# Patient Record
Sex: Male | Born: 1965 | Race: White | Hispanic: No | Marital: Married | Smoking: Never smoker
Health system: Southern US, Community
[De-identification: ages and names within clinical notes are randomized; demographics above are authoritative.]

## PROBLEM LIST (undated history)

## (undated) DIAGNOSIS — B029 Zoster without complications: Secondary | ICD-10-CM

## (undated) DIAGNOSIS — R0789 Other chest pain: Secondary | ICD-10-CM

## (undated) DIAGNOSIS — G542 Cervical root disorders, not elsewhere classified: Secondary | ICD-10-CM

## (undated) HISTORY — PX: OTHER SURGICAL HISTORY: SHX169

## (undated) HISTORY — PX: EYE SURGERY: SHX253

## (undated) HISTORY — DX: Zoster without complications: B02.9

## (undated) HISTORY — PX: INGUINAL HERNIA REPAIR: SUR1180

## (undated) HISTORY — PX: TONSILLECTOMY: SHX5217

## (undated) HISTORY — DX: Cervical root disorders, not elsewhere classified: G54.2

---

## 2000-08-24 ENCOUNTER — Other Ambulatory Visit: Admission: RE | Admit: 2000-08-24 | Discharge: 2000-08-24 | Payer: Self-pay | Admitting: Urology

## 2006-03-13 ENCOUNTER — Encounter: Admission: RE | Admit: 2006-03-13 | Discharge: 2006-03-13 | Payer: Self-pay | Admitting: Neurological Surgery

## 2012-06-29 ENCOUNTER — Other Ambulatory Visit: Payer: Self-pay | Admitting: *Deleted

## 2012-06-29 DIAGNOSIS — R079 Chest pain, unspecified: Secondary | ICD-10-CM

## 2012-07-05 ENCOUNTER — Ambulatory Visit (INDEPENDENT_AMBULATORY_CARE_PROVIDER_SITE_OTHER): Payer: No Typology Code available for payment source | Admitting: Cardiovascular Disease

## 2012-07-05 DIAGNOSIS — R079 Chest pain, unspecified: Secondary | ICD-10-CM

## 2012-07-05 NOTE — Progress Notes (Signed)
Exercise Treadmill Test  Pre-Exercise Testing Evaluation Rhythm: normal sinus  Rate: 77   PR:  .15 QRS:  .10  QT:  .39 QTc: .44            Test  Exercise Tolerance Test Ordering MD: Charlton Haws, MD  Interpreting MD: Charlton Haws, MD  Unique Test No: 1  Treadmill:  1  Indication for ETT: chest pain - rule out ischemia  Contraindication to ETT: No   Stress Modality: exercise - treadmill  Cardiac Imaging Performed: non   Protocol: standard Bruce - maximal  Max BP:  180/90  Max MPHR (bpm):  173 85% MPR (bpm):  147  MPHR obtained (bpm):  180 % MPHR obtained:  >100  Reached 85% MPHR (min:sec):    Total Exercise Time (min-sec):  10"00  Workload in METS:  >10 Borg Scale: 17  Reason ETT Terminated:  Positive response    ST Segment Analysis At Rest: normal ST segments - no evidence of significant ST depression With Exercise: significant ischemic ST depression  Other Information Arrhythmia:  No Angina during ETT:  absent (0) Quality of ETT:  diagnostic  ETT Interpretation:  abnormal - evidence of ST depression consistent with ischemia  Comments: Patient exercise well with first 4 stages advanced at 2 minute intervals.  Started to get ST depression at HR of 120 At peak exercise Had 2mm of ST segment depression in inferior lateral leads  Recommendations: 46 yo with positive family history of CAD.  One admitted episode of chest tightness with exercise about 2 weeks ago  Needs anatomic study Since overall clinical risk still low would favor cardiac CT over invasive heart catheterization.  Patient given bystolic to take if aproved.   Exertional dyspnea with exercise and no chest pain today

## 2012-07-06 ENCOUNTER — Other Ambulatory Visit: Payer: Self-pay | Admitting: *Deleted

## 2012-07-06 DIAGNOSIS — R079 Chest pain, unspecified: Secondary | ICD-10-CM

## 2012-07-06 DIAGNOSIS — R9439 Abnormal result of other cardiovascular function study: Secondary | ICD-10-CM

## 2012-07-07 ENCOUNTER — Ambulatory Visit (HOSPITAL_COMMUNITY)
Admission: RE | Admit: 2012-07-07 | Discharge: 2012-07-07 | Disposition: A | Discharge status: Home / Self Care | Payer: No Typology Code available for payment source | Source: Ambulatory Visit | Attending: Cardiovascular Disease | Admitting: Cardiovascular Disease

## 2012-07-07 DIAGNOSIS — R079 Chest pain, unspecified: Secondary | ICD-10-CM

## 2012-07-07 DIAGNOSIS — R9439 Abnormal result of other cardiovascular function study: Secondary | ICD-10-CM | POA: Insufficient documentation

## 2012-07-07 MED ORDER — IOHEXOL 350 MG/ML SOLN
100.0000 mL | Freq: Once | INTRAVENOUS | Status: AC | PRN
  Administered 2012-07-07: 100 mL via INTRAVENOUS

## 2012-07-07 MED ORDER — METOPROLOL TARTRATE 1 MG/ML IV SOLN
2.5000 mg | Freq: Once | INTRAVENOUS | Status: AC
  Administered 2012-07-07 (×2): 2.5 mg via INTRAVENOUS
  Filled 2012-07-07: qty 5

## 2012-07-07 MED ORDER — NITROGLYCERIN 0.4 MG SL SUBL
SUBLINGUAL_TABLET | SUBLINGUAL | Status: AC
  Administered 2012-07-07: 0.4 mg
  Filled 2012-07-07: qty 25

## 2012-07-07 MED ORDER — METOPROLOL TARTRATE 1 MG/ML IV SOLN
INTRAVENOUS | Status: AC
  Administered 2012-07-07: 2.5 mg via INTRAVENOUS
  Filled 2012-07-07: qty 10

## 2012-08-21 ENCOUNTER — Encounter (HOSPITAL_COMMUNITY): Payer: Self-pay | Admitting: *Deleted

## 2012-08-21 ENCOUNTER — Emergency Department (HOSPITAL_COMMUNITY)
Admission: EM | Admit: 2012-08-21 | Discharge: 2012-08-22 | Disposition: A | Discharge status: Home / Self Care | Payer: No Typology Code available for payment source | Attending: Emergency Medicine | Admitting: Emergency Medicine

## 2012-08-21 DIAGNOSIS — Z23 Encounter for immunization: Secondary | ICD-10-CM | POA: Insufficient documentation

## 2012-08-21 DIAGNOSIS — S0100XA Unspecified open wound of scalp, initial encounter: Secondary | ICD-10-CM | POA: Insufficient documentation

## 2012-08-21 DIAGNOSIS — W19XXXA Unspecified fall, initial encounter: Secondary | ICD-10-CM

## 2012-08-21 DIAGNOSIS — Z8679 Personal history of other diseases of the circulatory system: Secondary | ICD-10-CM | POA: Insufficient documentation

## 2012-08-21 DIAGNOSIS — Y9389 Activity, other specified: Secondary | ICD-10-CM | POA: Insufficient documentation

## 2012-08-21 DIAGNOSIS — W1809XA Striking against other object with subsequent fall, initial encounter: Secondary | ICD-10-CM | POA: Insufficient documentation

## 2012-08-21 DIAGNOSIS — Y92009 Unspecified place in unspecified non-institutional (private) residence as the place of occurrence of the external cause: Secondary | ICD-10-CM | POA: Insufficient documentation

## 2012-08-21 DIAGNOSIS — S0101XA Laceration without foreign body of scalp, initial encounter: Secondary | ICD-10-CM

## 2012-08-21 DIAGNOSIS — S060X9A Concussion with loss of consciousness of unspecified duration, initial encounter: Secondary | ICD-10-CM | POA: Insufficient documentation

## 2012-08-21 HISTORY — DX: Other chest pain: R07.89

## 2012-08-21 NOTE — ED Notes (Addendum)
Pt. Exp. Near syncope, without loc, in the bathroom and kitchen. Pt. Had wine last night. Denies neck and back pain. Hematoma on back of head, laceration, rt. Side. Bleeding controlled.

## 2012-08-22 ENCOUNTER — Emergency Department (HOSPITAL_COMMUNITY): Payer: No Typology Code available for payment source

## 2012-08-22 MED ORDER — ACETAMINOPHEN 325 MG PO TABS
650.0000 mg | ORAL_TABLET | Freq: Once | ORAL | Status: AC
  Administered 2012-08-22: 650 mg via ORAL
  Filled 2012-08-22: qty 2

## 2012-08-22 MED ORDER — TETANUS-DIPHTH-ACELL PERTUSSIS 5-2.5-18.5 LF-MCG/0.5 IM SUSP
0.5000 mL | Freq: Once | INTRAMUSCULAR | Status: AC
  Administered 2012-08-22: 0.5 mL via INTRAMUSCULAR
  Filled 2012-08-22: qty 0.5

## 2012-08-22 NOTE — ED Provider Notes (Signed)
History     CSN: [id number]  Arrival date & time 08/21/12  2342   First MD Initiated Contact with Patient 08/21/12 2342      Chief Complaint  Patient presents with  . Near Syncope  . Fall    (Consider location/radiation/quality/duration/timing/severity/associated sxs/prior treatment) The history is provided by the patient and the spouse.   47 year old male had a syncopal episode at home. He first had an episode where he was near syncopal and slumped to the floor. This occurred during urination. He ended his urinary stream. He denies injury at that time and his wife stated that he just slumped down and did not hit his head or hit anything with any force. He then went into his kitchen where he did fall out completely and hit the floor hard and apparently suffered a scalp laceration. He had amnesia around the events of the second fall and may have had fairly brief loss of consciousness. He currently has a headache which he rates it 5/10. He is unsure when his last tetanus immunization was. He denies other injury. He specifically denies neck or back pain. Of note, he recently had a cardiac workup which included a normal coronary CT angiogram. He also states that he had 2 glasses of wine earlier in the evening.  Past Medical History  Diagnosis Date  . Chest tightness     Past Surgical History  Procedure Date  . Stress test     History reviewed. No pertinent family history.  History  Substance Use Topics  . Smoking status: Never Smoker   . Smokeless tobacco: Not on file  . Alcohol Use: Yes      Review of Systems  All other systems reviewed and are negative.    Allergies  Tetracyclines & related  Home Medications  No current outpatient prescriptions on file.  There were no vitals taken for this visit.  Physical Exam  Nursing note and vitals reviewed.  47 year old male, with stiff cervical collar in place, resting comfortably and in no acute distress. Vital signs are  normal. Oxygen saturation is 100%, which is normal. Head is normocephalic there is a 1 cm scalp laceration on the occiput. PERRLA, EOMI. Oropharynx is clear. Neck is nontender. Back is nontender and there is no CVA tenderness. Lungs are clear without rales, wheezes, or rhonchi. Chest is nontender. Heart has regular rate and rhythm without murmur. Abdomen is soft, flat, nontender without masses or hepatosplenomegaly and peristalsis is normoactive. Extremities have no cyanosis or edema, full range of motion is present. Skin is warm and dry without rash. Neurologic: Mental status is normal, cranial nerves are intact, there are no motor or sensory deficits.  ED Course  Procedures (including critical care time)  LACERATION REPAIR Performed by: NWGNF,AOZHY Authorized by: QMVHQ,IONGE Consent: Verbal consent obtained. Risks and benefits: risks, benefits and alternatives were discussed Consent given by: patient Patient identity confirmed: provided demographic data Prepped and Draped in normal sterile fashion Wound explored  Laceration Location: scalp  Laceration Length: 1cm  No Foreign Bodies seen or palpated  Anesthesia: none  Amount of cleaning: standard  Skin closure: close   Number of staples: 2  Technique: stapling  Patient tolerance: Patient tolerated the procedure well with no immediate complications.   Ct Head Wo Contrast  08/22/2012  *RADIOLOGY REPORT*  Clinical Data:  Near syncope and fall.  Hematoma on the back of head with right-sided laceration.  CT HEAD WITHOUT CONTRAST CT CERVICAL SPINE WITHOUT CONTRAST  Technique:  Multidetector CT imaging of the head and cervical spine was performed following the standard protocol without intravenous contrast.  Multiplanar CT image reconstructions of the cervical spine were also generated.  Comparison:   None  CT HEAD  Findings: The ventricles and sulci are symmetrical without significant effacement, displacement, or dilatation. No  mass effect or midline shift. No abnormal extra-axial fluid collections. The grey-white matter junction is distinct. Basal cisterns are not effaced. No acute intracranial hemorrhage. No depressed skull fractures.  There is an air-fluid level in the left maxillary antrum which could be due to inflammatory or post-traumatic etiology.  Mastoid air cells are not opacified.  Small subcutaneous soft tissue hematoma over the right posterior vertex.  IMPRESSION: No acute intracranial abnormalities. Nonspecific air fluid level in the left maxillary antrum.  CT CERVICAL SPINE  Findings: Normal alignment of the cervical vertebrae and facet joints.  Mild degenerative changes with intervertebral disc space narrowing and hypertrophic changes at the endplates.  No vertebral compression deformities.  No prevertebral soft tissue swelling. Lateral masses of C1 appear symmetrical and the odontoid process appears intact.  No focal bone lesion or bone destruction.  The bone cortex and trabecular architecture appear intact.  No paraspinal soft tissue swelling.  IMPRESSION: No displaced fractures identified.   Original Report Authenticated By: Burman Nieves, M.D.    Ct Cervical Spine Wo Contrast  08/22/2012  *RADIOLOGY REPORT*  Clinical Data:  Near syncope and fall.  Hematoma on the back of head with right-sided laceration.  CT HEAD WITHOUT CONTRAST CT CERVICAL SPINE WITHOUT CONTRAST  Technique:  Multidetector CT imaging of the head and cervical spine was performed following the standard protocol without intravenous contrast.  Multiplanar CT image reconstructions of the cervical spine were also generated.  Comparison:   None  CT HEAD  Findings: The ventricles and sulci are symmetrical without significant effacement, displacement, or dilatation. No mass effect or midline shift. No abnormal extra-axial fluid collections. The grey-white matter junction is distinct. Basal cisterns are not effaced. No acute intracranial hemorrhage. No  depressed skull fractures.  There is an air-fluid level in the left maxillary antrum which could be due to inflammatory or post-traumatic etiology.  Mastoid air cells are not opacified.  Small subcutaneous soft tissue hematoma over the right posterior vertex.  IMPRESSION: No acute intracranial abnormalities. Nonspecific air fluid level in the left maxillary antrum.  CT CERVICAL SPINE  Findings: Normal alignment of the cervical vertebrae and facet joints.  Mild degenerative changes with intervertebral disc space narrowing and hypertrophic changes at the endplates.  No vertebral compression deformities.  No prevertebral soft tissue swelling. Lateral masses of C1 appear symmetrical and the odontoid process appears intact.  No focal bone lesion or bone destruction.  The bone cortex and trabecular architecture appear intact.  No paraspinal soft tissue swelling.  IMPRESSION: No displaced fractures identified.   Original Report Authenticated By: Burman Nieves, M.D.      Date: 08/22/2012  Rate: 85  Rhythm: normal sinus rhythm  QRS Axis: normal  Intervals: normal  ST/T Wave abnormalities: normal  Conduction Disutrbances:Incomplete right bundle-branch block  Narrative Interpretation: Left atrial hypertrophy, incomplete right palmar branch block. No prior ECG available for comparison.  Old EKG Reviewed: none available    1. Fall   2. Occipital scalp laceration   3. Concussion       MDM  Initial near syncopal episode sounds like micturition syncope. Etiology of second episode is unclear. With that amnesia around the events, it does appear  that he has at least a mild concussion. TdAP Booster will be given, and he will be sent for CT of head and cervical spine. Scalp laceration will need stapling.        Dione Booze, MD 08/22/12 [id number]

## 2012-10-11 ENCOUNTER — Telehealth: Payer: Self-pay | Admitting: Neurology

## 2012-10-11 ENCOUNTER — Encounter: Payer: Self-pay | Admitting: Neurology

## 2012-10-11 ENCOUNTER — Ambulatory Visit (INDEPENDENT_AMBULATORY_CARE_PROVIDER_SITE_OTHER): Payer: 59 | Admitting: Neurology

## 2012-10-11 VITALS — BP 145/94 | HR 80 | Ht 73.75 in | Wt 180.0 lb

## 2012-10-11 DIAGNOSIS — B029 Zoster without complications: Secondary | ICD-10-CM

## 2012-10-11 DIAGNOSIS — G542 Cervical root disorders, not elsewhere classified: Secondary | ICD-10-CM

## 2012-10-11 HISTORY — DX: Cervical root disorders, not elsewhere classified: G54.2

## 2012-10-11 HISTORY — DX: Zoster without complications: B02.9

## 2012-10-11 NOTE — Progress Notes (Signed)
Reason for visit: Shingles  Bobby Parker is an 47 y.o. 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 The patient began having severe pain in the left neck and shoulder that he interpreted as being muscle spasm. A CT scan of the cervical spine previously that showed a bone spur off to the left at the C4-5 level. He went on prednisone therapy, and Flexeril. Within 4 or 5 days after onset of pain, a rash was noted around the left elbow going down into the forearm and thumb. The rash extended upwards into the deltoid area. The rash was vesicular, and followed the C6 nerve root distribution. Around that time, he noted onset of weakness involving the left hand and arm. The pain has improved with treatment on prednisone and acyclovir. He will complete a seven-day course within the next 24 hours. The prednisone therapy is ongoing, and he is currently taking 20 mg daily. The patient indicates that there is some itching and numbness in the distribution of the rash. There have been no problems with the right arm, or with the legs. No problems with balance or problems controlling the bowels or the bladder had been noted. There is no pain down the arm with turning the head side to side or looking up or looking down. Approximately one month ago, he had a syncopal event, and struck his head. CT evaluation of the brain and cervical spine was done through the emergency room, and there was a mild postconcussive syndrome. The syncope has not recurred. This was associated with urinary incontinence.   ROS:  Out of a complete 14 system review of symptoms, the patient complains only of the following symptoms, and all other reviewed systems are negative.  Fatigue Rash Joint pain Muscle cramp Achy muscles Numbness Weakness Insomnia Decreased energy   Blood pressure 145/94, pulse 80, height 6' 1.75"  (1.873 m), weight 180 lb (81.647 kg).  Physical Exam  General: The patient is alert and cooperative at the time of the examination.  Head: Pupils are equal, round, and reactive to light. Discs are flat bilaterally.  Neck: The neck is supple, no carotid bruits are noted. The patient has excellent range of motion of the cervical spine.  Respiratory: The respiratory examination is clear.  Cardiovascular: The cardiovascular examination reveals a regular rate and rhythm, no obvious murmurs or rubs are noted.  Skin: Extremities are without significant edema. A C6 distribution healing shingles rash is noted on the left.  Neurologic Exam  Mental status: Alert and cooperative.  Cranial nerves: Facial symmetry is present. There is good sensation of the face to pinprick and soft touch bilaterally. The strength of the facial muscles and the muscles to head turning and shoulder shrug are normal bilaterally. Speech is well enunciated, no aphasia or dysarthria is noted. Extraocular movements are full. Visual fields are full.  Motor: The motor testing reveals 5 over 5 strength of the lower and right upper extremities. With the left upper extremity, there is 4/5 strength involving the external rotators, trace weakness of the left deltoid, and pronator teres muscle. There is 4 minus over 5 strength with the wrist extensors. No definite weakness of the intrinsic muscles of the left hand was noted. There may be slight weakness however, involving muscles that oppose the thumb. The left biceps muscle is trace weak. Good symmetric motor tone is noted throughout.  Sensory: Sensory testing is intact to pinprick, soft touch, vibration sensation, and position sense on all 4 extremities, with the exception that there is some decrease in pinprick sensation along the area of the C6 distribution rash on the left arm. No evidence of extinction is noted.  Coordination: Cerebellar testing reveals good finger-nose-finger and  heel-to-shin bilaterally.  Gait and station: Gait is normal. Tandem gait is normal. Romberg is negative. No drift is seen  Reflexes: Deep tendon reflexes are symmetric and normal bilaterally, with the exception that there is absence of the left ankle jerk reflex and left biceps reflex. The brachioradialis reflexes are depressed bilaterally. Toes are downgoing bilaterally.   Assessment/Plan:  One. Shingles outbreak, C6 level  2. Left upper extremity weakness secondary to #1.  The patient appears to have weakness mainly in the distribution of the C6 nerve root, but there may be some crossover into the C7 nerve root. The patient could have some involvement of the brachial plexus or multiple nerve roots. The rash is slowly within the C6 nerve root distribution. At this point, the patient will taper off of prednisone, and complete the acyclovir dosing. We will follow conservatively, but if return of motor function does not occur within the next 6-8 weeks, an EMG evaluation may be helpful for prognostication. 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 I will refer him to an occupational therapist for evaluation of a brace for the wrist drop that is present on the left arm. He will followup in about 2 months.   [name] MD 10/11/2012 9:46 AM

## 2012-10-11 NOTE — Patient Instructions (Signed)

## 2012-10-11 NOTE — Progress Notes (Deleted)
Reason for visit: Shingles  Bobby Parker is an 47 y.o. 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 The patient began having severe pain in the left neck and shoulder that he interpreted as being muscle spasm. A CT scan of the cervical spine previously that showed a bone spur off to the left at the C4-5 level. He went on prednisone therapy, and Flexeril. Within 4 or 5 days after onset of pain, a rash was noted around the left elbow going down into the forearm and thumb. The rash extended upwards into the deltoid area. The rash was vesicular, and follow the C6 nerve root distribution. Around that time, he noted onset of weakness involving the hand and arm. The pain has improved with treatment on prednisone and acyclovir. He will complete a seven-day course within the next 24 hours. The prednisone therapy is ongoing, and he is currently taking 20 mg daily. The patient indicates that there is some seen and numbness in the distribution of the rash. There have been no problems with the right arm, with the legs. No problems with balance or problems controlling the bowels or the bladder had been noted. There is no pain down the arm with turning the head side to side or looking up or looking down. Approximately one month ago, he had a syncopal event, and struck his head. CT evaluation of the brain and cervical spine was done through the emergency room, and there was a mild postconcussive syndrome. The syncope has not recurred. This was associated with urinary incontinence   ROS:  Out of a complete 14 system review of symptoms, the patient complains only of the following symptoms, and all other reviewed systems are negative.  Fatigue Rash Joint pain Muscle cramp Achy muscles Numbness Weakness Insomnia Decreased energy   Blood pressure 145/94, pulse 80, height 6' 1.75" (1.873 m),  weight 180 lb (81.647 kg).  Physical Exam  General: The patient is alert and cooperative at the time of the examination.  Skin: No significant peripheral edema is noted.   Neurologic Exam  Cranial nerves: Facial symmetry is present. Speech is normal, no aphasia or dysarthria is noted. Extraocular movements are full. Visual fields are full.  Motor: The patient has good strength in all 4 extremities.  Coordination: The patient has good finger-nose-finger and heel-to-shin bilaterally.  Gait and station: The patient has a normal gait. Tandem gait is normal. Romberg is negative. No drift is seen.  Reflexes: Deep tendon reflexes are symmetric.   Assessment/Plan:  One. Shingles outbreak, C6 level  2. Left upper extremity weakness secondary to #1.  The patient appears to have weakness mainly in the distribution of the C6 nerve root, but there may be some crossover into the C7 nerve root. The patient could have some involvement of the brachial plexus or multiple nerve roots. The rash is slowly within the C6 nerve root distribution. At this point, the patient will taper off of prednisone, and completely acyclovir dosing. We will follow conservatively, but if return of motor function is not occur within the next 6-8 weeks, and EMG evaluation may be helpful for prognostication. 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 I will refer him to an occupational therapist for evaluation of a brace for the wrist drop that is present on the left arm.  He will followup in about 2 months.   [name] MD 10/11/2012 9:21 AM

## 2014-04-04 IMAGING — CT CT HEART MORP W/ CTA COR W/ SCORE W/ CA W/CM &/OR W/O CM
2 of 7 series · 10 of 20 positions shown, 12 images · non-contrast
Comparison: Plain film 03/13/2006.

OVER-READ INTERPRETATION - CT CHEST

The following report is an over-read performed by radiologist Dr.
[DATE].  This over-read does not include interpretation of
cardiac or coronary anatomy or pathology.  The CTA interpretation
by the cardiologist is attached.
INDICATION: Chest Pain Positive Exercise Treadmill
PROTOCOL: The patient was scanned on a [employer] 256 scanner. Gantry
rotation speed was 270 msec. Collimation was .9mm.  The patient
received bystolic orally and 5 mg of iv lopresser during the scan.
Average HR during scan was 62 bpm.  SL nitro also given.  A 100 kV
prospective scan was triggered in the descending aorta at 111 HU's.
5% padding around 78% of the R-R interval was used to give 3
diagnostic phases.  iDose was [DATE] to minimize dose.  After an
initial AP and lateral topogram, non-contrast 3mm axial slices were
done for calcium scoring.  80 cc of contrast was then given for
CTA.  The 3D data set was viewed on a dedicated [employer] and
[employer] work station using MIP, VRT and MPR modes.

[Series 8: w/ edge cor., 78.0% · axial · 0.49mm/px · z∈[-241,-148]mm · 5 of 311 slices shown, 7 images]
[im 52/311  vessel]
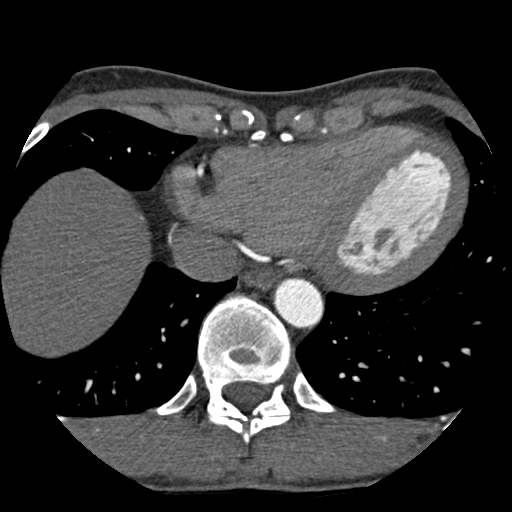
[im 52/311  lung]
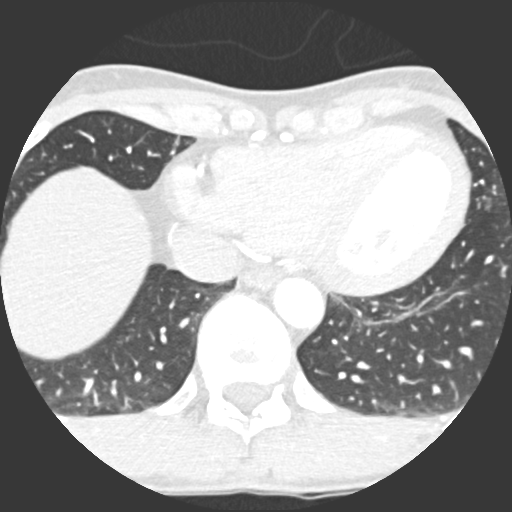
[im 104/311  vessel]
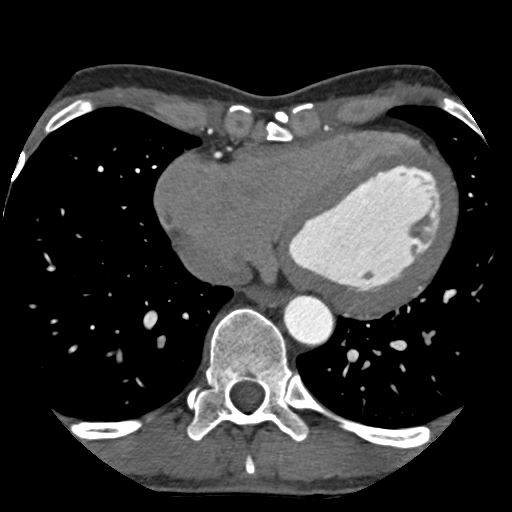
[im 156/311  vessel]
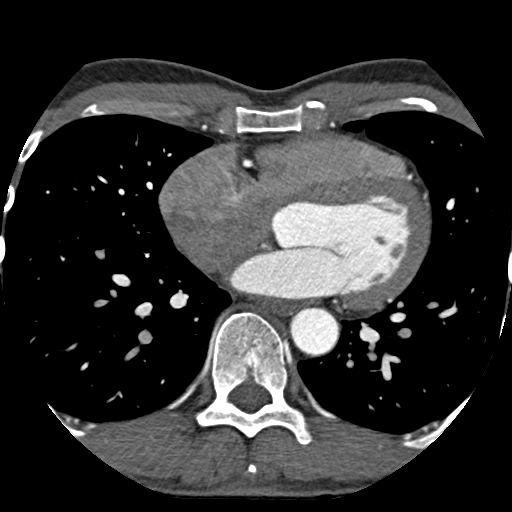
[im 207/311  vessel]
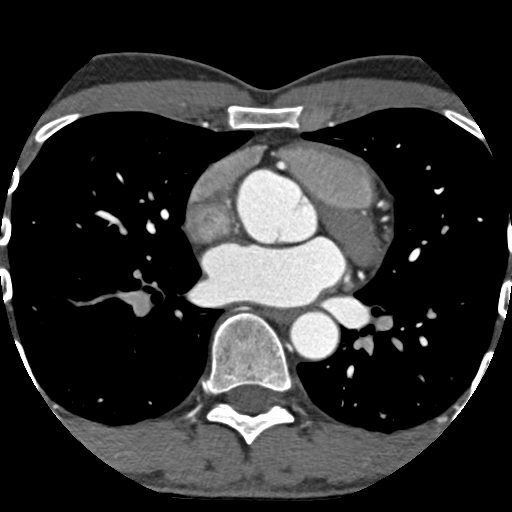
[im 259/311  vessel]
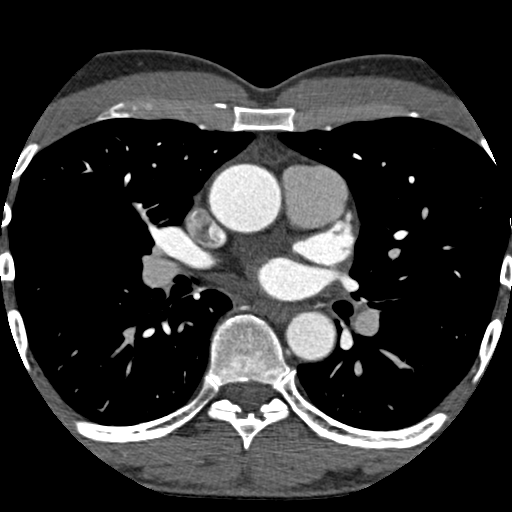
[im 259/311  lung]
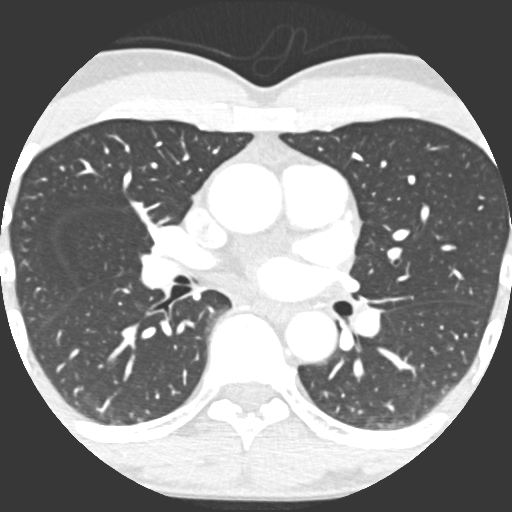

[Series 9: w/o edge corr., 78.0% · axial · non-contrast · 0.49mm/px · z∈[-241,-148]mm · 5 of 311 slices shown]
[im 52/311  lung]
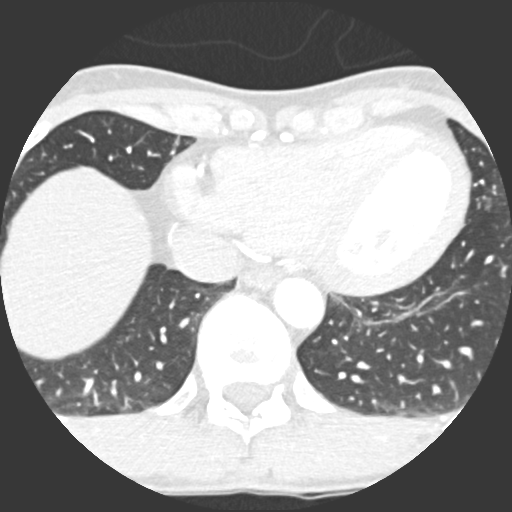
[im 104/311  lung]
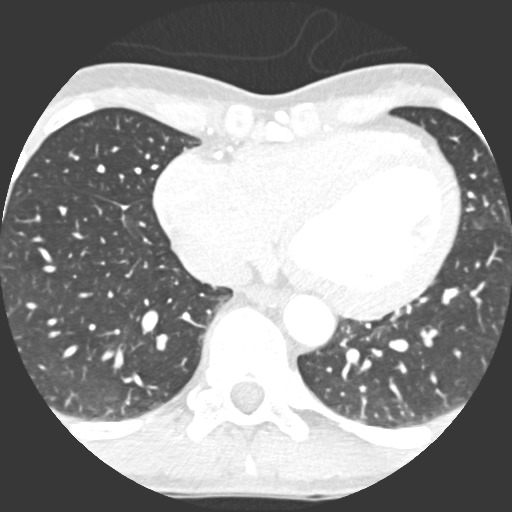
[im 156/311  lung]
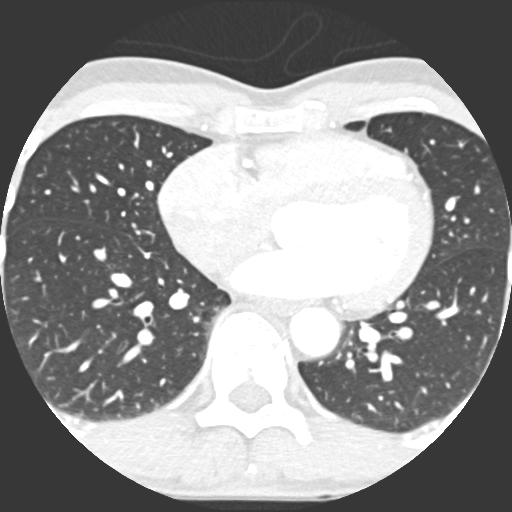
[im 207/311  lung]
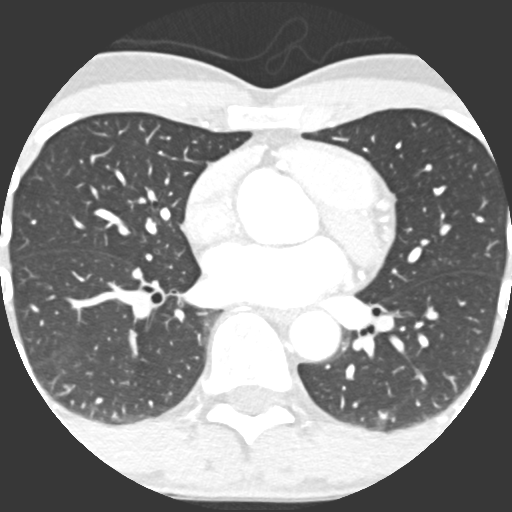
[im 259/311  lung]
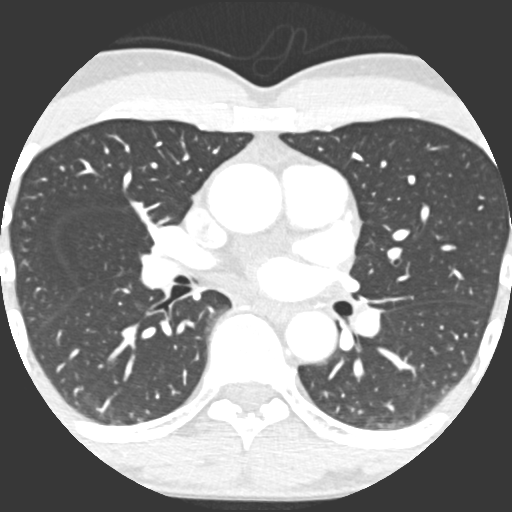

[10 of 20 positions shown; findings below may reference images not displayed]

FINDINGS: Lung windows demonstrate no nodules or airspace
opacities.

Soft tissue windows demonstrate a pectus excavatum deformity, which
accentuates the heart size.  No pericardial or pleural effusion.
Normal aortic caliber, without evidence of dissection.  No imaged
thoracic adenopathy.  No central pulmonary embolism, on this non-
dedicated study.

Limited abdominal imaging demonstrates no significant findings.
Mild spondylosis involving the mid to lower thoracic spine.
IMPRESSION: No significant extracardiac findings within the imaged chest.

Cardiac CT:
FINDINGS: Calcium Score: 0

Coronary Arteries: Right dominant with no anomalies

LM- normal

LAD- normal

      D1- large branch and normal

Circumflex- normal. The AV groove branch was small after the take
off of OM1

      OM1- large and normal

      OM2- small and normal

RCA- dominant and normal

      PDA- normal

      PLA- normal

Ascending aorta was normal with axial measurement at RPA of 3.4 cm.
No aneurysm or dissection

Non-cardiac Finings:  See separate report from Dr Chanra  Thoracic
osteophyte and mild pectus deformity only
IMPRESSION: 1)    Normal right dominant coronary arteries

2)    Calcium Score 0

## 2019-12-21 DIAGNOSIS — B5801 Toxoplasma chorioretinitis: Secondary | ICD-10-CM | POA: Insufficient documentation

## 2020-01-12 ENCOUNTER — Other Ambulatory Visit: Payer: Self-pay

## 2020-01-12 ENCOUNTER — Ambulatory Visit (INDEPENDENT_AMBULATORY_CARE_PROVIDER_SITE_OTHER): Payer: PRIVATE HEALTH INSURANCE | Admitting: Ophthalmology

## 2020-01-12 ENCOUNTER — Encounter (INDEPENDENT_AMBULATORY_CARE_PROVIDER_SITE_OTHER): Payer: No Typology Code available for payment source | Admitting: Ophthalmology

## 2020-01-12 ENCOUNTER — Encounter (INDEPENDENT_AMBULATORY_CARE_PROVIDER_SITE_OTHER): Payer: Self-pay | Admitting: Ophthalmology

## 2020-01-12 ENCOUNTER — Telehealth: Payer: Self-pay

## 2020-01-12 DIAGNOSIS — H3091 Unspecified chorioretinal inflammation, right eye: Secondary | ICD-10-CM | POA: Diagnosis not present

## 2020-01-12 DIAGNOSIS — B5801 Toxoplasma chorioretinitis: Secondary | ICD-10-CM | POA: Diagnosis not present

## 2020-01-12 MED ORDER — CLEOCIN 150 MG PO CAPS: ORAL_CAPSULE | ORAL | 3 refills | Status: AC

## 2020-01-12 NOTE — Progress Notes (Signed)
01/12/2020     CHIEF COMPLAINT Patient presents for Retina Evaluation   HISTORY OF PRESENT ILLNESS: Bobby Parker is a 54 y.o. male who presents to the clinic today for:   HPI    Retina Evaluation    In right eye.  This started 1 month ago.  Associated Symptoms Floaters.  Negative for Flashes.  Context:  distance vision.  Response to treatment was no improvement.          Comments    Patient states a month ago he noticed small floaters OD. Patient states that he was referred to a retinal specialist that states it was a PVD. Patient states it began to get worse (more floaters, strings)- leading to the retina pulling away. 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       Last edited by Gerda Diss on 01/12/2020  1:23 PM. (History)      Referring physician: Gaynelle Arabian, MD [address],  [place] [address]  HISTORICAL INFORMATION:   Selected notes from the [facility]: No current outpatient medications on file. (Ophthalmic Drugs)   No current facility-administered medications for this visit. (Ophthalmic Drugs)   Current Outpatient Medications (Other)  Medication Sig  . acetaminophen (TYLENOL) 500 MG tablet Take 500 mg by mouth every 6 (six) hours as needed for pain. (Patient not taking: Reported on 01/12/2020)  . acyclovir (ZOVIRAX) 800 MG tablet Take 800 mg by mouth 2 (two) times daily. One tablet every four hrs. (Patient not taking: Reported on 01/12/2020)  . predniSONE (DELTASONE) 20 MG tablet Take 20 mg by mouth 2 (two) times daily. (Patient not taking: Reported on 01/12/2020)  . tadalafil (CIALIS) 20 MG tablet Take 10 mg by mouth daily as needed.   No current facility-administered medications for this visit. (Other)      REVIEW OF SYSTEMS:    ALLERGIES Allergies  Allergen Reactions  . Tetracyclines & Related Hives    urticaria    PAST  MEDICAL HISTORY Past Medical History:  Diagnosis Date  . Cervical root lesions, not elsewhere classified 10/11/2012   C6 Shingles, left, brachial plexus, C7  . Chest tightness   . Shingles outbreak 10/11/2012   Past Surgical History:  Procedure Laterality Date  . EYE SURGERY Bilateral    Lasix in 2007  . INGUINAL HERNIA REPAIR Right   . stress test    . TONSILLECTOMY      FAMILY HISTORY Family History  Problem Relation Age of Onset  . Hypertension Mother   . Hypertension Father   . Diabetes Father     SOCIAL HISTORY Social History   Tobacco Use  . Smoking status: Never Smoker  . Smokeless tobacco: Never Used  Substance Use Topics  . Alcohol use: Yes    Comment: 2-3 glasses weekly  . Drug use: No         OPHTHALMIC EXAM: Base Eye Exam    Visual Acuity (Snellen - Linear)      Right Left   Dist cc 20/40-1 20/20   Dist ph cc NI    Correction: Glasses       Tonometry (Tonopen, 1:26 PM)      Right Left   Pressure 10 10       Pupils      Pupils Dark Light Shape React APD   Right PERRL 4 3 Round Brisk  None   Left PERRL 4 3 Round Brisk None       Visual Fields (Counting fingers)      Left Right    Full Full       Extraocular Movement      Right Left    Full Full       Neuro/Psych    Oriented x3: Yes   Mood/Affect: Normal       Dilation    Both eyes: 1.0% Mydriacyl, 2.5% Phenylephrine @ 1:26 PM        Slit Lamp and Fundus Exam    External Exam      Right Left   External Normal Normal       Slit Lamp Exam      Right Left   Lids/Lashes Normal Normal   Conjunctiva/Sclera White and quiet White and quiet   Cornea Clear Clear   Anterior Chamber Deep and quiet Deep and quiet   Iris Round and reactive Round and reactive   Vitreous Normal Normal          IMAGING AND PROCEDURES  Imaging and Procedures for 01/12/20  OCT, Retina - OU - Both Eyes       Right Eye Quality was good. Scan locations included subfoveal.   Left Eye Quality  was good. Scan locations included subfoveal.                 ASSESSMENT/PLAN:  No problem-specific Assessment & Plan notes found for this encounter.    No diagnosis found.  1.  2.  3.  Ophthalmic Meds Ordered this visit:  No orders of the defined types were placed in this encounter.      No follow-ups on file.  There are no Patient Instructions on file for this visit.   Explained the diagnoses, plan, and follow up with the patient and they expressed understanding.  Patient expressed understanding of the importance of proper follow up care.   Alford Highland Shylie Polo M.D. Diseases & Surgery of the Retina and Vitreous [facility] 01/12/20     Abbreviations: M myopia (nearsighted); A astigmatism; H hyperopia (farsighted); P presbyopia; Mrx spectacle prescription;  CTL contact lenses; OD right eye; OS left eye; OU both eyes  XT exotropia; ET esotropia; PEK punctate epithelial keratitis; PEE punctate epithelial erosions; DES dry eye syndrome; MGD meibomian gland dysfunction; ATs artificial tears; PFAT's preservative free artificial tears; NSC nuclear sclerotic cataract; PSC posterior subcapsular cataract; ERM epi-retinal membrane; PVD posterior vitreous detachment; RD retinal detachment; DM diabetes mellitus; DR diabetic retinopathy; NPDR non-proliferative diabetic retinopathy; PDR proliferative diabetic retinopathy; CSME clinically significant macular edema; DME diabetic macular edema; dbh dot blot hemorrhages; CWS cotton wool spot; POAG primary open angle glaucoma; C/D cup-to-disc ratio; HVF humphrey visual field; GVF goldmann visual field; OCT optical coherence tomography; IOP intraocular pressure; BRVO Branch retinal vein occlusion; CRVO central retinal vein occlusion; CRAO central retinal artery occlusion; BRAO branch retinal artery occlusion; RT retinal tear; SB scleral buckle; PPV pars plana vitrectomy; VH Vitreous hemorrhage; PRP panretinal laser  photocoagulation; IVK intravitreal kenalog; VMT vitreomacular traction; MH Macular hole;  NVD neovascularization of the disc; NVE neovascularization elsewhere; AREDS age related eye disease study; ARMD age related macular degeneration; POAG primary open angle glaucoma; EBMD epithelial/anterior basement membrane dystrophy; ACIOL anterior chamber intraocular lens; IOL intraocular lens; PCIOL posterior chamber intraocular lens; Phaco/IOL phacoemulsification with intraocular lens placement; PRK photorefractive keratectomy; LASIK laser assisted in situ keratomileusis; HTN hypertension; DM diabetes mellitus; COPD chronic obstructive  pulmonary disease

## 2020-01-12 NOTE — Assessment & Plan Note (Addendum)
I recommended the patient add Cleocin 300 mg p.o. 3 times daily to provide dual antibiosis against the most likely causative agent, toxoplasmosis.  I have also provided the patient with a high colony count over-the-counter probiotic to try to prevent C. difficile colitis.     I have contacted [facility] and arrangements have been made for the patient to be seen by Dr. [place] Deaner, vitreal retinal fellow with training and uveitis from the [facility] clinic.  This arrangement was develop through my contact with Dr. Anthony Sar at   The patient will be arriving before 8:30AM knowing that it might be a long morning session.

## 2020-01-12 NOTE — Telephone Encounter (Signed)
COVID-19 Pre-Screening Questions:01/12/20   Do you currently have a fever (>100 F), chills or unexplained body aches?NO  Are you currently experiencing new cough, shortness of breath, sore throat, runny nose? NO .  Have you recently travelled outside the state of [place] in the last 14 days? NO  .  Have you been in contact with someone that is currently pending confirmation of Covid19 testing or has been confirmed to have the Covid19 virus? NO  **If the patient answers NO to ALL questions -  advise the patient to please call the clinic before coming to the office should any symptoms develop.     

## 2020-01-13 ENCOUNTER — Ambulatory Visit: Payer: PRIVATE HEALTH INSURANCE | Admitting: Infectious Disease

## 2020-01-16 ENCOUNTER — Ambulatory Visit: Payer: PRIVATE HEALTH INSURANCE | Admitting: Infectious Disease

## 2020-01-16 ENCOUNTER — Encounter: Payer: Self-pay | Admitting: Infectious Disease

## 2020-01-16 ENCOUNTER — Other Ambulatory Visit: Payer: Self-pay

## 2020-01-16 VITALS — BP 116/73 | HR 82 | Temp 98.3°F | Ht 73.0 in | Wt 181.0 lb

## 2020-01-16 DIAGNOSIS — H3091 Unspecified chorioretinal inflammation, right eye: Secondary | ICD-10-CM | POA: Diagnosis not present

## 2020-01-16 DIAGNOSIS — B5801 Toxoplasma chorioretinitis: Secondary | ICD-10-CM

## 2020-01-16 LAB — CBC WITH DIFFERENTIAL/PLATELET
Abs Immature Granulocytes: 0.03 10*3/uL (ref 0.00–0.07)
Basophils Absolute: 0 10*3/uL (ref 0.0–0.1)
Basophils Relative: 0 %
Eosinophils Absolute: 0 10*3/uL (ref 0.0–0.5)
Eosinophils Relative: 0 %
HCT: 42.6 % (ref 39.0–52.0)
Hemoglobin: 14 g/dL (ref 13.0–17.0)
Immature Granulocytes: 0 %
Lymphocytes Relative: 9 %
Lymphs Abs: 0.6 10*3/uL — ABNORMAL LOW (ref 0.7–4.0)
MCH: 31.6 pg (ref 26.0–34.0)
MCHC: 32.9 g/dL (ref 30.0–36.0)
MCV: 96.2 fL (ref 80.0–100.0)
Monocytes Absolute: 0.2 10*3/uL (ref 0.1–1.0)
Monocytes Relative: 3 %
Neutro Abs: 5.9 10*3/uL (ref 1.7–7.7)
Neutrophils Relative %: 88 %
Platelets: 215 10*3/uL (ref 150–400)
RBC: 4.43 MIL/uL (ref 4.22–5.81)
RDW: 12.3 % (ref 11.5–15.5)
WBC: 6.8 10*3/uL (ref 4.0–10.5)
nRBC: 0 % (ref 0.0–0.2)

## 2020-01-16 LAB — BASIC METABOLIC PANEL
Anion gap: 10 (ref 5–15)
BUN: 26 mg/dL — ABNORMAL HIGH (ref 6–20)
CO2: 24 mmol/L (ref 22–32)
Calcium: 9.6 mg/dL (ref 8.9–10.3)
Chloride: 103 mmol/L (ref 98–111)
Creatinine, Ser: 1.2 mg/dL (ref 0.61–1.24)
GFR calc Af Amer: >60 mL/min (ref >60)
GFR calc non Af Amer: >60 mL/min (ref >60)
Glucose, Bld: 122 mg/dL — ABNORMAL HIGH (ref 70–99)
Potassium: 4.9 mmol/L (ref 3.5–5.1)
Sodium: 137 mmol/L (ref 135–145)

## 2020-01-16 NOTE — Progress Notes (Signed)
Subjective:  Reason for Infectious Disease Consult: Toxoplasma chorioretinitis  Requesting Physician: Dr. [name]   Patient ID: Bobby Parker, male    DOB: 07/04/1966, 54 y.o.   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 Roughly a month ago he experienced floaters in his visual field and was seen by [name] who believed initially thought to be PVD. 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 He was seen the following day by Dr. [name] for 2nd opinion and he also believed that this was  Toxoplasmosis. [name] had labs performed through [facility] which I have reviewed. Toxoplasma IgG was over 60, IgM negative. HIV -,  CD4 normal. QF gold negative. BMP notable for creatinine of 1.29 though this was in context of two DS bactrim BID load and then DS bactrim BID. Note Dr [name] had also wanted to add oral clindamycin but Dr. Saintclair Halsted did not wish to do so due to concerns for CDI.  In the interim he was seen at  [facility]  By Dr. [name] who also agreed that this was very likely Toxo. He checked ACE level  Which was normal.   He gave rx for valtrex in case this was HSV (which he still doubted as being the cause) though Dr. Saintclair Halsted conveyed that this was mentioned as prophylaxis.   Steroids were also rx but he was  Instructed not to start them until syphilis testing and QF gold negative (both negative  Yesterday).  Oval has had headaches since starting high dose valtrex. He is concerned re risk of toxicity of meds used to treat his condition and also concerned of effects of corticosteroids. He had problems with HTN post steroids the last time he took them (in  Context of  VZV eruption.   He still sees floaters in OD but when he focuses for example while performing surgery they  disappear.     Past Medical History:  Diagnosis Date  . Cervical root lesions, not elsewhere classified 10/11/2012   C6 Shingles, left, brachial plexus, C7  . Chest tightness   . Shingles outbreak 10/11/2012    Past Surgical History:  Procedure Laterality Date  . EYE SURGERY Bilateral    Lasix in 2007  . INGUINAL HERNIA REPAIR Right   . stress test    . TONSILLECTOMY      Family History  Problem Relation Age of Onset  . Hypertension Mother   . Hypertension Father   . Diabetes Father       Social History   Socioeconomic History  . Marital status: Married    Spouse name: Bobby Parker  . Number of children: 2  . Years of education: MD  . Highest education level: Not on file  Occupational History  . Occupation: Materials engineer: [employer]    Comment: Pt. works at [employer]  . Smoking status: Never Smoker  . Smokeless tobacco: Never Used  Substance and Sexual Activity  . Alcohol use: Yes    Comment: 2-3 glasses weekly  . Drug use: No  . Sexual activity: Not on file  Other Topics Concern  . Not on file  Social History Narrative  . Not on file   Social Determinants of Health   Financial Resource  Strain:   . Difficulty of Paying Living Expenses:   Food Insecurity:   . Worried About Programme researcher, broadcasting/film/video in the Last Year:   . Barista in the Last Year:   Transportation Needs:   . Freight forwarder (Medical):   Marland Kitchen Lack of Transportation (Non-Medical):   Physical Activity:   . Days of Exercise per Week:   . Minutes of Exercise per Session:   Stress:   . Feeling of Stress :   Social Connections:   . Frequency of Communication with Friends and Family:   . Frequency of Social Gatherings with Friends and Family:   . Attends Religious Services:   . Active Member of Clubs or Organizations:   . Attends Banker Meetings:   Marland Kitchen Marital Status:     Allergies  Allergen Reactions  . Tetracyclines & Related Hives     urticaria     Current Outpatient Medications:  .  acetaminophen (TYLENOL) 500 MG tablet, Take 500 mg by mouth every 6 (six) hours as needed for pain. , Disp: , Rfl:  .  prednisoLONE acetate (PRED FORTE) 1 % ophthalmic suspension, Apply 1 drop to eye in the morning, at noon, in the evening, and at bedtime. Right eye, Disp: , Rfl:  .  predniSONE (DELTASONE) 20 MG tablet, Take 20 mg by mouth 2 (two) times daily. , Disp: , Rfl:  .  sulfamethoxazole-trimethoprim (BACTRIM DS) 800-160 MG tablet, Take 1 tablet by mouth in the morning and at bedtime., Disp: , Rfl:  .  tadalafil (CIALIS) 20 MG tablet, Take 10 mg by mouth daily as needed., Disp: , Rfl:  .  valACYclovir (VALTREX) 1000 MG tablet, Take 1 tablet by mouth in the morning and at bedtime., Disp: , Rfl:  .  acyclovir (ZOVIRAX) 800 MG tablet, Take 800 mg by mouth 2 (two) times daily. One tablet every four hrs. (Patient not taking: Reported on 01/12/2020), Disp: , Rfl:  .  CLEOCIN 150 MG capsule, 150 mgms,, use 2 tablets ,,,, three times daily po (Patient not taking: Reported on 01/16/2020), Disp: 180 capsule, Rfl: 3   Review of Systems  Constitutional: Negative for activity change, appetite change, chills, diaphoresis, fatigue, fever and unexpected weight change.  HENT: Negative for congestion, rhinorrhea, sinus pressure, sneezing, sore throat and trouble swallowing.   Eyes: Positive for visual disturbance. Negative for photophobia.  Respiratory: Negative for cough, chest tightness, shortness of breath, wheezing and stridor.   Cardiovascular: Negative for chest pain, palpitations and leg swelling.  Gastrointestinal: Negative for abdominal distention, abdominal pain, anal bleeding, blood in stool, constipation, diarrhea, nausea and vomiting.  Genitourinary: Negative for difficulty urinating, dysuria, flank pain and hematuria.  Musculoskeletal: Negative for arthralgias, back pain, gait problem, joint swelling and myalgias.  Skin: Negative for color  change, pallor, rash and wound.  Neurological: Positive for headaches. Negative for dizziness, tremors, weakness and light-headedness.  Hematological: Negative for adenopathy. Does not bruise/bleed easily.  Psychiatric/Behavioral: Negative for agitation, behavioral problems, confusion, decreased concentration, dysphoric mood and sleep disturbance.       Objective:   Physical Exam Constitutional:      Appearance: He is well-developed.  HENT:     Head: Normocephalic and atraumatic.  Eyes:     Extraocular Movements: Extraocular movements intact.     Conjunctiva/sclera: Conjunctivae normal.  Cardiovascular:     Rate and Rhythm: Normal rate and regular rhythm.  Pulmonary:     Effort: Pulmonary effort is normal. No respiratory distress.  Breath sounds: No wheezing.  Abdominal:     General: There is no distension.     Palpations: Abdomen is soft.  Musculoskeletal:        General: No tenderness. Normal range of motion.     Cervical back: Normal range of motion and neck supple.  Skin:    General: Skin is warm and dry.     Coloration: Skin is not pale.     Findings: No erythema or rash.  Neurological:     General: No focal deficit present.     Mental Status: He is alert and oriented to person, place, and time.  Psychiatric:        Mood and Affect: Mood normal.        Behavior: Behavior normal.        Thought Content: Thought content normal.        Judgment: Judgment normal.           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 Marcene Brawn at [facility] ([place]) Dr. Warden Fillers MD ([place] rogram director ocular immunology ane uveitis fellowship program at [facility] in [facility], Maxwell Magone Tempie Donning [place] at [facility] and Thane Edu  ([place]) [place] Director of the [employer] laboratory for [employer] of Toxoplasmosis at [place]. Dr. Braulio Bosch sent me literature review. Currently besides older regimen of PYR, Sulfadiazine, leucovorin and systemic  steroids, more recently Bactrim DS BID appears to be  More  Standard of care with other agents being more largely used for refractory cases. Steroids also being added within 48 hours to 3 days of initiating treatment   Elevated creatinine: likely due  To effect of TMP inhibiting distal secretion of creatinine in distal tubules. I dont think his GFR is effected and repeat lab show cr down to 1.2  Lymphopenia: noted on todays labs but not last weeks  Seems unlikely to be bactrim given isolated cell line and Cassie Kuppelweiser concurs. Will get yet another Pharm D opinion

## 2020-01-26 ENCOUNTER — Encounter (INDEPENDENT_AMBULATORY_CARE_PROVIDER_SITE_OTHER): Payer: PRIVATE HEALTH INSURANCE | Admitting: Ophthalmology

## 2020-01-31 ENCOUNTER — Other Ambulatory Visit: Payer: Self-pay

## 2020-01-31 ENCOUNTER — Ambulatory Visit (INDEPENDENT_AMBULATORY_CARE_PROVIDER_SITE_OTHER): Payer: PRIVATE HEALTH INSURANCE | Admitting: Ophthalmology

## 2020-01-31 ENCOUNTER — Encounter (INDEPENDENT_AMBULATORY_CARE_PROVIDER_SITE_OTHER): Payer: Self-pay | Admitting: Ophthalmology

## 2020-01-31 DIAGNOSIS — B5801 Toxoplasma chorioretinitis: Secondary | ICD-10-CM

## 2020-01-31 NOTE — Assessment & Plan Note (Signed)
Patient continues on oral Bactrim DS 1 tablet twice daily.  He is also in the midst of tapering prednisone from 60 mg daily to currently using 30 mg daily to planning to do a 20 mg dose tomorrow.  Patient has elected to use a more rapid taper for other medical considerations.  It is clear the current therapy has improved the inflammatory debris and his visual functioning  Is also clear that the chorioretinal active retinitis, while improved, is still active.  Patient encouraged to stay on the Bactrim DS until this has completely scarred, and/or pigmented.  He is not using clindamycin  He continues to use high-dose probiotics daily

## 2020-01-31 NOTE — Progress Notes (Signed)
01/31/2020     CHIEF COMPLAINT Patient presents for Retina Follow Up   HISTORY OF PRESENT ILLNESS: Bobby Parker is a 54 y.o. male who presents to the clinic today for:   HPI    Retina Follow Up    Patient presents with  Other.  In right eye.  Duration of 2 weeks.  Since onset it is stable.          Comments    2 week follow up - OCT OU, FP OU Patient states that his eye has improved since last visit.       Last edited by Berenice Bouton on 01/31/2020  3:46 PM. (History)      Referring physician: Blair Heys, MD [address] [place],  [place] [address]  HISTORICAL INFORMATION:   Selected notes from the MEDICAL RECORD NUMBER       CURRENT MEDICATIONS: Current Outpatient Medications (Ophthalmic Drugs)  Medication Sig  . prednisoLONE acetate (PRED FORTE) 1 % ophthalmic suspension Apply 1 drop to eye in the morning, at noon, in the evening, and at bedtime. Right eye   No current facility-administered medications for this visit. (Ophthalmic Drugs)   Current Outpatient Medications (Other)  Medication Sig  . acetaminophen (TYLENOL) 500 MG tablet Take 500 mg by mouth every 6 (six) hours as needed for pain.   Marland Kitchen acyclovir (ZOVIRAX) 800 MG tablet Take 800 mg by mouth 2 (two) times daily. One tablet every four hrs. (Patient not taking: Reported on 01/12/2020)  . CLEOCIN 150 MG capsule 150 mgms,, use 2 tablets ,,,, three times daily po (Patient not taking: Reported on 01/16/2020)  . predniSONE (DELTASONE) 20 MG tablet Take 20 mg by mouth 2 (two) times daily.   Marland Kitchen sulfamethoxazole-trimethoprim (BACTRIM DS) 800-160 MG tablet Take 1 tablet by mouth in the morning and at bedtime.  . tadalafil (CIALIS) 20 MG tablet Take 10 mg by mouth daily as needed.  . valACYclovir (VALTREX) 1000 MG tablet Take 1 tablet by mouth in the morning and at bedtime.   No current facility-administered medications for this visit. (Other)      REVIEW OF SYSTEMS:    ALLERGIES Allergies    Allergen Reactions  . Tetracyclines & Related Hives    urticaria    PAST MEDICAL HISTORY Past Medical History:  Diagnosis Date  . Cervical root lesions, not elsewhere classified 10/11/2012   C6 Shingles, left, brachial plexus, C7  . Chest tightness   . Shingles outbreak 10/11/2012   Past Surgical History:  Procedure Laterality Date  . EYE SURGERY Bilateral    Lasix in 2007  . INGUINAL HERNIA REPAIR Right   . stress test    . TONSILLECTOMY      FAMILY HISTORY Family History  Problem Relation Age of Onset  . Hypertension Mother   . Hypertension Father   . Diabetes Father     SOCIAL HISTORY Social History   Tobacco Use  . Smoking status: Never Smoker  . Smokeless tobacco: Never Used  Substance Use Topics  . Alcohol use: Yes    Comment: 2-3 glasses weekly  . Drug use: No         OPHTHALMIC EXAM:  Base Eye Exam    Visual Acuity (Snellen - Linear)      Right Left   Dist cc 20/20 20/20   Correction: Glasses       Tonometry (Tonopen, 3:49 PM)      Right Left   Pressure 13 12  Pupils      Pupils Dark Light Shape React APD   Right PERRL 4 3 Round Brisk None   Left PERRL 4 3 Round Brisk None       Visual Fields (Counting fingers)      Left Right    Full Full       Extraocular Movement      Right Left    Full Full       Neuro/Psych    Oriented x3: Yes   Mood/Affect: Normal       Dilation    Right eye: 1.0% Mydriacyl, 2.5% Phenylephrine @ 3:49 PM        Slit Lamp and Fundus Exam    External Exam      Right Left   External Normal Normal       Slit Lamp Exam      Right Left   Lids/Lashes Normal Normal   Conjunctiva/Sclera White and quiet White and quiet   Cornea Clear Clear   Anterior Chamber Deep and quiet Deep and quiet   Iris Round and reactive Round and reactive   Lens 1+ Nuclear sclerosis 1+ Nuclear sclerosis   Anterior Vitreous Normal Normal       Fundus Exam      Right Left   Posterior Vitreous 3+ Cells, posterior  vitreous detachment with granulomatous deposition of inflammatory cells on the posterior hyaloid    Disc Normal    C/D Ratio 0.3    Macula Perifoveal deposition of   Smaller less prominent granulomatous preretinal deposits    Vessels PERI phlebitis throughout the posterior pole particularly around a large nodular lesion inferiorly 6:00 meridian near the equator    Periphery 2.5 disc diameter chorioretinal white inflammatory lesion, improving with less retinal inflammation, less retinal whitening this is located at 6:00 meridian just posterior to the equator.  There are no other peripheral retinal scars.            IMAGING AND PROCEDURES  Imaging and Procedures for 01/31/20  OCT, Retina - OU - Both Eyes       Right Eye Quality was good. Scan locations included subfoveal. Central Foveal Thickness: 288. Progression has improved.   Left Eye Quality was good. Scan locations included subfoveal. Central Foveal Thickness: 267.   Notes Preretinal prefoveal inflammatory debris, smaller and less dense from toxoplasmosis retinal choroiditis       Color Fundus Photography Optos - OU - Both Eyes       Right Eye Progression has improved. Disc findings include normal observations. Macula : normal observations.   Left Eye Progression has no prior data. Disc findings include normal observations. Macula : normal observations. Vessels : normal observations. Periphery : normal observations.   Notes Vitreous and hyaloid debris on the posterior hyaloid, much improved with less inflammatory debris.  Peripheral chorioretinal lesion inferior the optic nerve slightly posterior to the equator with less inflammatory debris, slightly smaller.                ASSESSMENT/PLAN:  Toxoplasmosis chorioretinitis of right eye Patient continues on oral Bactrim DS 1 tablet twice daily.  He is also in the midst of tapering prednisone from 60 mg daily to currently using 30 mg daily to planning to do a 20  mg dose tomorrow.  Patient has elected to use a more rapid taper for other medical considerations.  It is clear the current therapy has improved the inflammatory debris and his visual functioning  Is also clear  that the chorioretinal active retinitis, while improved, is still active.  Patient encouraged to stay on the Bactrim DS until this has completely scarred, and/or pigmented.  He is not using clindamycin  He continues to use high-dose probiotics daily      ICD-10-CM   1. Toxoplasmosis chorioretinitis of right eye  B58.01 OCT, Retina - OU - Both Eyes    Color Fundus Photography Optos - OU - Both Eyes    1.  Patient is to follow-up with [facility] in 3 weeks as scheduled  2.  We will offer to see the patient here in 6 weeks or as needed  3.  Ophthalmic Meds Ordered this visit:  No orders of the defined types were placed in this encounter.      Return in about 6 weeks (around 03/13/2020) for dilate, OD, COLOR FP, OCT.  There are no Patient Instructions on file for this visit.   Explained the diagnoses, plan, and follow up with the patient and they expressed understanding.  Patient expressed understanding of the importance of proper follow up care.   Alford Highland Jasreet Dickie M.D. Diseases & Surgery of the Retina and Vitreous [facility] 01/31/20     Abbreviations: M myopia (nearsighted); A astigmatism; H hyperopia (farsighted); P presbyopia; Mrx spectacle prescription;  CTL contact lenses; OD right eye; OS left eye; OU both eyes  XT exotropia; ET esotropia; PEK punctate epithelial keratitis; PEE punctate epithelial erosions; DES dry eye syndrome; MGD meibomian gland dysfunction; ATs artificial tears; PFAT's preservative free artificial tears; NSC nuclear sclerotic cataract; PSC posterior subcapsular cataract; ERM epi-retinal membrane; PVD posterior vitreous detachment; RD retinal detachment; DM diabetes mellitus; DR diabetic retinopathy; NPDR non-proliferative  diabetic retinopathy; PDR proliferative diabetic retinopathy; CSME clinically significant macular edema; DME diabetic macular edema; dbh dot blot hemorrhages; CWS cotton wool spot; POAG primary open angle glaucoma; C/D cup-to-disc ratio; HVF humphrey visual field; GVF goldmann visual field; OCT optical coherence tomography; IOP intraocular pressure; BRVO Branch retinal vein occlusion; CRVO central retinal vein occlusion; CRAO central retinal artery occlusion; BRAO branch retinal artery occlusion; RT retinal tear; SB scleral buckle; PPV pars plana vitrectomy; VH Vitreous hemorrhage; PRP panretinal laser photocoagulation; IVK intravitreal kenalog; VMT vitreomacular traction; MH Macular hole;  NVD neovascularization of the disc; NVE neovascularization elsewhere; AREDS age related eye disease study; ARMD age related macular degeneration; POAG primary open angle glaucoma; EBMD epithelial/anterior basement membrane dystrophy; ACIOL anterior chamber intraocular lens; IOL intraocular lens; PCIOL posterior chamber intraocular lens; Phaco/IOL phacoemulsification with intraocular lens placement; PRK photorefractive keratectomy; LASIK laser assisted in situ keratomileusis; HTN hypertension; DM diabetes mellitus; COPD chronic obstructive pulmonary disease

## 2020-03-20 ENCOUNTER — Encounter (INDEPENDENT_AMBULATORY_CARE_PROVIDER_SITE_OTHER): Payer: PRIVATE HEALTH INSURANCE | Admitting: Ophthalmology

## 2020-04-05 ENCOUNTER — Encounter (INDEPENDENT_AMBULATORY_CARE_PROVIDER_SITE_OTHER): Payer: PRIVATE HEALTH INSURANCE | Admitting: Ophthalmology

## 2020-04-26 ENCOUNTER — Ambulatory Visit (INDEPENDENT_AMBULATORY_CARE_PROVIDER_SITE_OTHER): Payer: PRIVATE HEALTH INSURANCE | Admitting: Ophthalmology

## 2020-04-26 ENCOUNTER — Encounter (INDEPENDENT_AMBULATORY_CARE_PROVIDER_SITE_OTHER): Payer: Self-pay | Admitting: Ophthalmology

## 2020-04-26 ENCOUNTER — Other Ambulatory Visit: Payer: Self-pay

## 2020-04-26 DIAGNOSIS — B5801 Toxoplasma chorioretinitis: Secondary | ICD-10-CM

## 2020-04-26 NOTE — Progress Notes (Signed)
04/26/2020     CHIEF COMPLAINT Patient presents for Retina Follow Up   HISTORY OF PRESENT ILLNESS: Bobby Parker is a 54 y.o. male who presents to the clinic today for:   HPI    Retina Follow Up    Diagnosis: Toxoplasmosis.  In right eye.  Severity is moderate.  Duration of 3 months.  Since onset it is stable.  I, the attending physician,  performed the HPI with the patient and updated documentation appropriately.          Comments    3 Month Toxoplasmosis f\u OD. FP and OCT  Pt states OD is gradually improving. Pt is currently only taking Bactrim       Last edited by Elyse Jarvis on 04/26/2020  3:56 PM. (History)      Referring physician: Blair Heys, MD [address] [place],  [place] [address]  HISTORICAL INFORMATION:   Selected notes from the MEDICAL RECORD NUMBER       CURRENT MEDICATIONS: Current Outpatient Medications (Ophthalmic Drugs)  Medication Sig  . prednisoLONE acetate (PRED FORTE) 1 % ophthalmic suspension Apply 1 drop to eye in the morning, at noon, in the evening, and at bedtime. Right eye   No current facility-administered medications for this visit. (Ophthalmic Drugs)   Current Outpatient Medications (Other)  Medication Sig  . acetaminophen (TYLENOL) 500 MG tablet Take 500 mg by mouth every 6 (six) hours as needed for pain.   Marland Kitchen acyclovir (ZOVIRAX) 800 MG tablet Take 800 mg by mouth 2 (two) times daily. One tablet every four hrs. (Patient not taking: Reported on 01/12/2020)  . predniSONE (DELTASONE) 20 MG tablet Take 20 mg by mouth 2 (two) times daily.   Marland Kitchen sulfamethoxazole-trimethoprim (BACTRIM DS) 800-160 MG tablet Take 1 tablet by mouth in the morning and at bedtime.  . tadalafil (CIALIS) 20 MG tablet Take 10 mg by mouth daily as needed.  . valACYclovir (VALTREX) 1000 MG tablet Take 1 tablet by mouth in the morning and at bedtime.   No current facility-administered medications for this visit. (Other)      REVIEW OF  SYSTEMS:    ALLERGIES Allergies  Allergen Reactions  . Tetracyclines & Related Hives    urticaria    PAST MEDICAL HISTORY Past Medical History:  Diagnosis Date  . Cervical root lesions, not elsewhere classified 10/11/2012   C6 Shingles, left, brachial plexus, C7  . Chest tightness   . Shingles outbreak 10/11/2012   Past Surgical History:  Procedure Laterality Date  . EYE SURGERY Bilateral    Lasix in 2007  . INGUINAL HERNIA REPAIR Right   . stress test    . TONSILLECTOMY      FAMILY HISTORY Family History  Problem Relation Age of Onset  . Hypertension Mother   . Hypertension Father   . Diabetes Father     SOCIAL HISTORY Social History   Tobacco Use  . Smoking status: Never Smoker  . Smokeless tobacco: Never Used  Substance Use Topics  . Alcohol use: Yes    Comment: 2-3 glasses weekly  . Drug use: No         OPHTHALMIC EXAM:  Base Eye Exam    Visual Acuity (Snellen - Linear)      Right Left   Dist cc 20/20 20/20   Correction: Glasses       Tonometry (Tonopen, 3:55 PM)      Right Left   Pressure 6 6  Pupils      Pupils Dark Light Shape React APD   Right PERRL 4 3 Round Brisk None   Left PERRL 4 3 Round Brisk None       Neuro/Psych    Oriented x3: Yes   Mood/Affect: Normal       Dilation    Right eye: 1.0% Mydriacyl, 2.5% Phenylephrine @ 3:55 PM        Slit Lamp and Fundus Exam    External Exam      Right Left   External Normal Normal       Slit Lamp Exam      Right Left   Lids/Lashes Normal Normal   Conjunctiva/Sclera White and quiet White and quiet   Cornea Clear Clear   Anterior Chamber No cells Deep and quiet   Iris Round and reactive Round and reactive   Lens 1+ Nuclear sclerosis 1+ Nuclear sclerosis   Anterior Vitreous Rare cell Normal       Fundus Exam      Right Left   Posterior Vitreous Bobby Cells, posterior vitreous detachment with granulomatous deposition of inflammatory cells on the posterior hyaloid     Disc Normal    C/D Ratio 0.45    Macula No cells in the posterior hyaloid, no free foveal cells, and clinically epiretinal membrane is not appreciable on clinical examination    Vessels PERI phlebitis totally resolved throughout the posterior pole particularly around a large nodular lesion inferiorly 6:00 meridian near the equator    Periphery 2.5 disc diameter chorioretinal with no active cellular debris.  This is located at 6:00 meridian just posterior to the equator.  There are no other peripheral retinal scars.  My examination today this is now a totally involutional lesion with a large area of surrounding chorioretinal atrophy           IMAGING AND PROCEDURES  Imaging and Procedures for 04/26/20  OCT, Retina - OU - Both Eyes       Right Eye Quality was good. Scan locations included subfoveal. Central Foveal Thickness: 302. Progression has been stable. Findings include epiretinal membrane.   Left Eye Quality was good. Scan locations included subfoveal. Central Foveal Thickness: 260. Progression has been stable. Findings include vitreomacular adhesion , normal foveal contour.   Notes No precellular debris remains, minor epiretinal membrane without any topographic distortion at all       Color Fundus Photography Optos - OU - Both Eyes       Right Eye Progression has been stable. Disc findings include normal observations. Vessels : normal observations.   Left Eye Progression has been stable. Disc findings include normal observations. Macula : normal observations. Vessels : normal observations. Periphery : normal observations.   Notes Scar inferiorly at 6:00 near the equator, completely and active.  There is no cellular debris, there is extensive RPE atrophy and essentially pigmented area which correlates clinically with no active cells with 90 diopter and  78 diopter examination .  Vasculature, no para phlebitis No other peripheral chorioretinal lesions in either eye                 ASSESSMENT/PLAN:  Toxoplasmosis chorioretinitis of right eye Chorioretinal scar inferiorly 6:00 meridian, has completely returned involutional and there is no active cellular debris no signs of active inflammation  My opinion the oral medication can be halted at any time.  Should he can continue to use oral medication he should continue on the Probiotics supplements  ICD-10-CM   1. Toxoplasmosis chorioretinitis of right eye  B58.01 OCT, Retina - OU - Both Eyes    Color Fundus Photography Optos - OU - Both Eyes    1.  Primary toxoplasmosis chorioretinitis of the right eye is now completely involutional with resolution of most vitreous debris, all inflammatory signs have resolved OD.  2.  Patient with has been given instructions by [facility] to continue the oral Bactrim for another 2-1/2 months.  3.  My opinion like he could go cease the use of this at any time in the near future.  Ophthalmic Meds Ordered this visit:  No orders of the defined types were placed in this encounter.      Return if symptoms worsen or fail to improve.  There are no Patient Instructions on file for this visit.   Explained the diagnoses, plan, and follow up with the patient and they expressed understanding.  Patient expressed understanding of the importance of proper follow up care.   Alford Highland Marlo Goodrich M.D. Diseases & Surgery of the Retina and Vitreous [facility] 04/26/20     Abbreviations: M myopia (nearsighted); A astigmatism; H hyperopia (farsighted); P presbyopia; Mrx spectacle prescription;  CTL contact lenses; OD right eye; OS left eye; OU both eyes  XT exotropia; ET esotropia; PEK punctate epithelial keratitis; PEE punctate epithelial erosions; DES dry eye syndrome; MGD meibomian gland dysfunction; ATs artificial tears; PFAT's preservative free artificial tears; NSC nuclear sclerotic cataract; PSC posterior subcapsular cataract; ERM epi-retinal membrane;  PVD posterior vitreous detachment; RD retinal detachment; DM diabetes mellitus; DR diabetic retinopathy; NPDR non-proliferative diabetic retinopathy; PDR proliferative diabetic retinopathy; CSME clinically significant macular edema; DME diabetic macular edema; dbh dot blot hemorrhages; CWS cotton wool spot; POAG primary open angle glaucoma; C/D cup-to-disc ratio; HVF humphrey visual field; GVF goldmann visual field; OCT optical coherence tomography; IOP intraocular pressure; BRVO Branch retinal vein occlusion; CRVO central retinal vein occlusion; CRAO central retinal artery occlusion; BRAO branch retinal artery occlusion; RT retinal tear; SB scleral buckle; PPV pars plana vitrectomy; VH Vitreous hemorrhage; PRP panretinal laser photocoagulation; IVK intravitreal kenalog; VMT vitreomacular traction; MH Macular hole;  NVD neovascularization of the disc; NVE neovascularization elsewhere; AREDS age related eye disease study; ARMD age related macular degeneration; POAG primary open angle glaucoma; EBMD epithelial/anterior basement membrane dystrophy; ACIOL anterior chamber intraocular lens; IOL intraocular lens; PCIOL posterior chamber intraocular lens; Phaco/IOL phacoemulsification with intraocular lens placement; PRK photorefractive keratectomy; LASIK laser assisted in situ keratomileusis; HTN hypertension; DM diabetes mellitus; COPD chronic obstructive pulmonary disease

## 2020-04-26 NOTE — Assessment & Plan Note (Signed)
Chorioretinal scar inferiorly 6:00 meridian, has completely returned involutional and there is no active cellular debris no signs of active inflammation  My opinion the oral medication can be halted at any time.  Should he can continue to use oral medication he should continue on the Probiotics supplements

## 2022-10-01 DIAGNOSIS — Z23 Encounter for immunization: Secondary | ICD-10-CM | POA: Diagnosis not present

## 2022-10-01 DIAGNOSIS — N529 Male erectile dysfunction, unspecified: Secondary | ICD-10-CM | POA: Diagnosis not present

## 2022-10-01 DIAGNOSIS — R748 Abnormal levels of other serum enzymes: Secondary | ICD-10-CM | POA: Diagnosis not present

## 2022-10-01 DIAGNOSIS — B589 Toxoplasmosis, unspecified: Secondary | ICD-10-CM | POA: Diagnosis not present

## 2022-10-01 DIAGNOSIS — Z131 Encounter for screening for diabetes mellitus: Secondary | ICD-10-CM | POA: Diagnosis not present

## 2022-10-01 DIAGNOSIS — Z125 Encounter for screening for malignant neoplasm of prostate: Secondary | ICD-10-CM | POA: Diagnosis not present

## 2022-10-01 DIAGNOSIS — E7800 Pure hypercholesterolemia, unspecified: Secondary | ICD-10-CM | POA: Diagnosis not present

## 2022-10-01 DIAGNOSIS — Z1329 Encounter for screening for other suspected endocrine disorder: Secondary | ICD-10-CM | POA: Diagnosis not present

## 2022-11-24 ENCOUNTER — Other Ambulatory Visit: Payer: Self-pay

## 2022-11-24 ENCOUNTER — Ambulatory Visit (INDEPENDENT_AMBULATORY_CARE_PROVIDER_SITE_OTHER): Payer: BC Managed Care – PPO | Admitting: Sports Medicine

## 2022-11-24 ENCOUNTER — Encounter: Payer: Self-pay | Admitting: Sports Medicine

## 2022-11-24 DIAGNOSIS — M79604 Pain in right leg: Secondary | ICD-10-CM | POA: Diagnosis not present

## 2022-11-24 DIAGNOSIS — S76311A Strain of muscle, fascia and tendon of the posterior muscle group at thigh level, right thigh, initial encounter: Secondary | ICD-10-CM

## 2022-11-24 DIAGNOSIS — M7121 Synovial cyst of popliteal space [Baker], right knee: Secondary | ICD-10-CM | POA: Diagnosis not present

## 2022-11-24 NOTE — Progress Notes (Signed)
Bobby Parker - 57 y.o. male MRN [id number]  Date of birth: Jun 21, 1966  Office Visit Note: Visit Date: 11/24/2022 PCP: Blair Heys, MD Referred by: Blair Heys, MD  Subjective: Chief Complaint  Patient presents with   Right Leg - Pain   HPI: Bobby Parker is a pleasant 57 y.o. male who presents today for right hamstring and posterior knee pain. 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 He is quite fit and active individual.  Wing has had some mid posterior thigh and posterior knee pain for about 2 weeks.  He has been training to do some upcoming hiking with his daughter in [place] later this summer and Roda Shutters has been doing more physical activity, blunt use, stretching and other lower extremity work.  2 weeks ago he was on vacation and after repetitive lunges, stretching and flexion of the knee and felt a soreness like pain in the mid thigh.  Also felt some pain behind the knee.  He still is able to continue playing pickle ball and other activities.  Did feel some minor discomfort in the posterior knee.  He has backed off some of the stretching activities and his pain has gotten better to presents further evaluation today.  No swelling in the knee.  No locking/clicking/catching or giving way of the knee.  Has a history of prior meniscal injury on the contralateral knee, his right knee has been without injury.  Pertinent ROS were reviewed with the patient and found to be negative unless otherwise specified above in HPI.   Assessment & Plan: Visit Diagnoses:  1. Strain of right hamstring, initial encounter   2. Baker's cyst of knee, right    Plan: Discussed with Juron that based on his exam and ultrasound findings he does have evidence of a hamstring strain, likely grade 2b (from Grade 1- 4 using BAMIC) as this does involve the area of the myotendinous junction of the semimembranosus-semitendinosus tendons spanning about 20-25% of the cross-sectional area.  He also has a small Baker's cyst  within the popliteal fossa of the knee.  I did discuss with him there is could have been all along but he is recent activities may have increased the size which is what he noticed with the fullness in the back of the knee.  I do believe these are 2 separate pathologies as a Baker's cyst usually stems from intra-articular pathology of the knee.  Patient exam shows a very stable knee without any provocative maneuvers for meniscal tearing.  He will continue with activity modification, counseled him on static stretching.  We did provide him with aspirin hamstring exercises for him to start rehabbing the hamstring.  He will continue with all activity as his pain allows but would caution static stretching and forced hyperextension/flexion maneuvers about the knee until he feels like he is 100%.  He may obtain a body helix hamstring and/or knee compressive sleeve to be used with activity in the future as needed.  He will follow-up with me on an as-needed basis.  Follow-up: Return if symptoms worsen or fail to improve.   Meds & Orders: No orders of the defined types were placed in this encounter.   Orders Placed This Encounter  Procedures   [place] Extrem Low Right Ltd     Procedures: No procedures performed      Clinical History: No specialty comments available.  He reports that he has never smoked. He has never used smokeless tobacco. No results for input(s): "HGBA1C", "LABURIC" in  the last 8760 hours.  Objective:   Vital Signs: There were no vitals taken for this visit.  Physical Exam  Gen: Well-appearing, in no acute distress; non-toxic CV:  Well-perfused. Warm.  Resp: Breathing unlabored on room air; no wheezing. Psych: Fluid speech in conversation; appropriate affect; normal thought process Neuro: Sensation intact throughout. No gross coordination deficits.   Ortho Exam - Right knee: Return joint line TTP.  Full range of motion from 0-135 degrees.  There is a small fullness in the posterior  aspect of the popliteal fossa.  No effusion of the knee joint.  No varus or valgus instability.  Ligamentously intact.  Strength 5/5 throughout.  - Right hamstrings: No musculature defect with active and passive testing.  No significant TTP palpating the muscle bellies.  No redness or, ecchymosis or swelling.  Imaging: [place] Extrem Low Right Ltd  Result Date: 11/24/2022 Limited musculoskeletal ultrasound of the right lower extremity, right hamstrings and posterior knee was performed today.  Evaluation of the hamstrings just superior to the popliteal fossa visualized and intact semitendinosus and semimembranosus tendon.  Popliteus partially visualized without evidence of tearing.  There is a small Baker's cyst seated between the medial head of the gastrocnemius and semimembranosus tendon.  This is approximately 3.4 x 0.05 cm in size.  Scanning of the hamstrings proximally about the mid thigh near the myotendinous junction there is some tendon discontinuity near the myotendinous junction of the semitendinosis and semimembranosus tendons.  This spans about 20 to 25% of the tendon diameter, there is no full-thickness tearing here.  Mild hyperemia in this location.  Biceps femoris without irregularity.        Past Medical/Family/Surgical/Social History: Medications & Allergies reviewed per EMR, new medications updated. 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   Shingles outbreak 10/11/2012    Class: Acute   Cervical root lesions, not elsewhere classified 10/11/2012    Class: Acute   Past Medical History:  Diagnosis Date   Cervical root lesions, not elsewhere classified 10/11/2012   C6 Shingles, left, brachial plexus, C7   Chest tightness    Shingles outbreak 10/11/2012   Family History  Problem Relation Age of Onset   Hypertension Mother    Hypertension Father    Diabetes Father    Past  Surgical History:  Procedure Laterality Date   EYE SURGERY Bilateral    Lasix in 2007   INGUINAL HERNIA REPAIR Right    stress test     TONSILLECTOMY     Social History   Occupational History   Occupation: Financial trader: [employer]    Comment: Pt. works at [employer]  Tobacco Use   Smoking status: Never   Smokeless tobacco: Never  Substance and Sexual Activity   Alcohol use: Yes    Comment: 2-3 glasses weekly   Drug use: No   Sexual activity: Not on file

## 2022-12-19 DIAGNOSIS — B5801 Toxoplasma chorioretinitis: Secondary | ICD-10-CM | POA: Diagnosis not present

## 2022-12-19 DIAGNOSIS — H3091 Unspecified chorioretinal inflammation, right eye: Secondary | ICD-10-CM | POA: Diagnosis not present

## 2022-12-25 ENCOUNTER — Telehealth: Payer: Self-pay

## 2022-12-25 DIAGNOSIS — I2510 Atherosclerotic heart disease of native coronary artery without angina pectoris: Secondary | ICD-10-CM

## 2022-12-25 NOTE — Progress Notes (Incomplete)
CARDIOLOGY CONSULT NOTE       Patient ID: Bobby Parker MRN: [id number] DOB/AGE: September 08, 1965 57 y.o.  Admit date: (Not on file) Referring Physician: Self Primary Physician: Blair Heys, MD Primary Cardiologist: New Reason for Consultation: CAD  Active Problems:   * No active hospital problems. *   HPI:  57 y.o. self referred for risk stratification CAD. 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 Cardiac CTA done in 07/07/12 showed normal right dominant cors with calcium score of 0 Labs by primary 10/01/22 showed TC 217 HDL 77 and LDL 131 LFTs normal even during Rx with anti parasite   ***  ROS All other systems reviewed and negative except as noted above  Past Medical History:  Diagnosis Date  . Cervical root lesions, not elsewhere classified 10/11/2012   C6 Shingles, left, brachial plexus, C7  . Chest tightness   . Shingles outbreak 10/11/2012    Family History  Problem Relation Age of Onset  . Hypertension Mother   . Hypertension Father   . Diabetes Father     Social History   Socioeconomic History  . Marital status: Married    Spouse name: Lavonna Rua  . Number of children: 2  . Years of education: MD  . Highest education level: Not on file  Occupational History  . Occupation: Financial trader: [employer]    Comment: Pt. works at [employer]  . Smoking status: Never  . Smokeless tobacco: Never  Substance and Sexual Activity  . Alcohol use: Yes    Comment: 2-3 glasses weekly  . Drug use: No  . Sexual activity: Not on file  Other Topics Concern  . Not on file  Social History Narrative  . Not on file   Social Determinants of Health   Financial Resource Strain: Not on file  Food Insecurity: Not on file  Transportation Needs: Not on file  Physical Activity: Not on file  Stress: Not on file  Social Connections: Not on file  Intimate  Partner Violence: Not on file    Past Surgical History:  Procedure Laterality Date  . EYE SURGERY Bilateral    Lasix in 2007  . INGUINAL HERNIA REPAIR Right   . stress test    . TONSILLECTOMY        Current Outpatient Medications:  .  acetaminophen (TYLENOL) 500 MG tablet, Take 500 mg by mouth every 6 (six) hours as needed for pain. , Disp: , Rfl:  .  acyclovir (ZOVIRAX) 800 MG tablet, Take 800 mg by mouth 2 (two) times daily. One tablet every four hrs. (Patient not taking: Reported on 01/12/2020), Disp: , Rfl:  .  prednisoLONE acetate (PRED FORTE) 1 % ophthalmic suspension, Apply 1 drop to eye in the morning, at noon, in the evening, and at bedtime. Right eye, Disp: , Rfl:  .  predniSONE (DELTASONE) 20 MG tablet, Take 20 mg by mouth 2 (two) times daily. , Disp: , Rfl:  .  sulfamethoxazole-trimethoprim (BACTRIM DS) 800-160 MG tablet, Take 1 tablet by mouth in the morning and at bedtime., Disp: , Rfl:  .  tadalafil (CIALIS) 20 MG tablet, Take 10 mg by mouth daily as needed., Disp: , Rfl:     Physical Exam: There were no vitals taken for this visit.    Affect appropriate Healthy:  appears stated age HEENT: normal Neck supple with no adenopathy JVP  normal no bruits no thyromegaly Lungs clear with no wheezing and good diaphragmatic motion Heart:  S1/S2 no murmur, no rub, gallop or click PMI normal Abdomen: benighn, BS positve, no tenderness, no AAA no bruit.  No HSM or HJR Distal pulses intact with no bruits No edema Neuro non-focal Skin warm and dry No muscular weakness   Labs:   Lab Results  Component Value Date   WBC 6.8 01/16/2020   HGB 14.0 01/16/2020   HCT 42.6 01/16/2020   MCV 96.2 01/16/2020   PLT 215 01/16/2020   No results for input(s): "NA", "K", "CL", "CO2", "BUN", "CREATININE", "CALCIUM", "PROT", "BILITOT", "ALKPHOS", "ALT", "AST", "GLUCOSE" in the last 168 hours.  Invalid input(s): "LABALBU" No results found for: "CKTOTAL", "CKMB", "CKMBINDEX",  "TROPONINI" No results found for: "CHOL" No results found for: "HDL" No results found for: "LDLCALC" No results found for: "TRIG" No results found for: "CHOLHDL" No results found for: "LDLDIRECT"    Radiology: No results found.  EKG: ***   ASSESSMENT AND PLAN:   CAD Risk:  *** Toxo Choriaretinits:  f/u Dr Luciana Axe and Everlena Cooper at [facility] post Rx with Atovaquone  Signed: Charlton Haws 12/25/2022, 1:55 PM

## 2022-12-25 NOTE — Telephone Encounter (Signed)
Per Dr. Eden Emms ordered patient a CT calcium score and scheduled an office visit with Dr. Eden Emms.

## 2023-01-02 ENCOUNTER — Ambulatory Visit: Payer: PRIVATE HEALTH INSURANCE | Admitting: Cardiovascular Disease

## 2023-01-02 NOTE — Progress Notes (Signed)
CARDIOLOGY CONSULT NOTE       Patient ID: Bobby Parker MRN: [id number] DOB/AGE: [birth year] 57 y.o.  Admit date: (Not on file) Referring Physician: Ehinger Primary Physician: Blair Heys, MD Primary Cardiologist: New Reason for Consultation: CAD Risk     HPI:  57 y.o. referred by Dr Manus Gunning for CAD risk. 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 Prior vagal syncope and atypical chest pain Cardiac CTA done 07/07/12 with calcium score 0 with normal right dominant coronary arteries. 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 Younger daughter graduated from [employer] and working in [place].   Calcium score done today 01/15/23 reviewed  IMPRESSION: Coronary calcium score of 22.3. This was 52 percentile for age-, race-, and sex-matched controls.   Mildly dilated aortic root (3.9 cm).  He has no cardiac symptoms active Discussed starting low dose crestor to get LDL below 100   ROS All other systems reviewed and negative except as noted above  Past Medical History:  Diagnosis Date   Cervical root lesions, not elsewhere classified 10/11/2012   C6 Shingles, left, brachial plexus, C7   Chest tightness    Shingles outbreak 10/11/2012    Family History  Problem Relation Age of Onset   Hypertension Mother    Hypertension Father    Diabetes Father     Social History   Socioeconomic History   Marital status: Married    Spouse name: Sheri   Number of children: 2   Years of education: MD   Highest education level: Not on file  Occupational History   Occupation: Financial trader: [employer]    Comment: Pt. works at [employer]  Tobacco Use   Smoking status: Never   Smokeless tobacco: Never  Substance and Sexual Activity    Alcohol use: Yes    Comment: 2-3 glasses weekly   Drug use: No   Sexual activity: Not on file  Other Topics Concern   Not on file  Social History Narrative   Not on file   Social Determinants of Health   Financial Resource Strain: Not on file  Food Insecurity: Not on file  Transportation Needs: Not on file  Physical Activity: Not on file  Stress: Not on file  Social Connections: Not on file  Intimate Partner Violence: Not on file    Past Surgical History:  Procedure Laterality Date   EYE SURGERY Bilateral    Lasix in 2007   INGUINAL HERNIA REPAIR Right    stress test     TONSILLECTOMY        Current Outpatient Medications:    acetaminophen (TYLENOL) 500 MG tablet, Take 500 mg by mouth every 6 (six) hours as needed for pain. , Disp: , Rfl:    acyclovir (ZOVIRAX) 800 MG tablet, Take 800 mg by mouth 2 (two) times daily. One tablet every four hrs. (Patient not taking: Reported on 01/12/2020), Disp: , Rfl:    prednisoLONE acetate (PRED FORTE) 1 % ophthalmic suspension, Apply 1 drop to eye in the morning, at noon, in the evening, and at bedtime. Right eye, Disp: , Rfl:    predniSONE (DELTASONE) 20 MG tablet, Take 20 mg by mouth 2 (two) times daily. , Disp: , Rfl:    sulfamethoxazole-trimethoprim (  BACTRIM DS) 800-160 MG tablet, Take 1 tablet by mouth in the morning and at bedtime., Disp: , Rfl:    tadalafil (CIALIS) 20 MG tablet, Take 10 mg by mouth daily as needed., Disp: , Rfl:     Physical Exam: There were no vitals taken for this visit.   Affect appropriate Healthy:  appears stated age HEENT: normal Neck supple with no adenopathy JVP normal no bruits no thyromegaly Lungs clear with no wheezing and good diaphragmatic motion Heart:  S1/S2 no murmur, no rub, gallop or click PMI normal Abdomen: benighn, BS positve, no tenderness, no AAA no bruit.  No HSM or HJR Distal pulses intact with no bruits No edema Neuro non-focal Skin warm and dry No muscular  weakness   Labs:   Lab Results  Component Value Date   WBC 6.8 01/16/2020   HGB 14.0 01/16/2020   HCT 42.6 01/16/2020   MCV 96.2 01/16/2020   PLT 215 01/16/2020   No results for input(s): "NA", "K", "CL", "CO2", "BUN", "CREATININE", "CALCIUM", "PROT", "BILITOT", "ALKPHOS", "ALT", "AST", "GLUCOSE" in the last 168 hours.  Invalid input(s): "LABALBU" No results found for: "CKTOTAL", "CKMB", "CKMBINDEX", "TROPONINI" No results found for: "CHOL" No results found for: "HDL" No results found for: "LDLCALC" No results found for: "TRIG" No results found for: "CHOLHDL" No results found for: "LDLDIRECT"    Radiology: No results found.  EKG: no recent   ASSESSMENT AND PLAN:   CAD:  risk prior calcium score 0 with normal right dominant cors 07/07/12  Updated this week and score 22.3 see below start statin HLD:  LDL 131 start crestor 5 mg daily Discussed possible myalgias and side effects Toxo:  uveitis right eye in remission fu [facility] Vasovagal Syncope:  non recurrent     Crestor 5 mg Lipid / Liver in 3 months  Signed: Charlton Haws 01/02/2023, 4:49 PM

## 2023-01-15 ENCOUNTER — Encounter: Payer: Self-pay | Admitting: Cardiovascular Disease

## 2023-01-15 ENCOUNTER — Ambulatory Visit: Payer: BC Managed Care – PPO | Admitting: Cardiovascular Disease

## 2023-01-15 ENCOUNTER — Ambulatory Visit (HOSPITAL_COMMUNITY)
Admission: RE | Admit: 2023-01-15 | Discharge: 2023-01-15 | Disposition: A | Discharge status: Home / Self Care | Payer: Self-pay | Source: Ambulatory Visit | Attending: Cardiovascular Disease | Admitting: Cardiovascular Disease

## 2023-01-15 VITALS — BP 114/80 | HR 64 | Ht 73.0 in | Wt 184.4 lb

## 2023-01-15 DIAGNOSIS — E782 Mixed hyperlipidemia: Secondary | ICD-10-CM

## 2023-01-15 DIAGNOSIS — R931 Abnormal findings on diagnostic imaging of heart and coronary circulation: Secondary | ICD-10-CM | POA: Insufficient documentation

## 2023-01-15 DIAGNOSIS — I2510 Atherosclerotic heart disease of native coronary artery without angina pectoris: Secondary | ICD-10-CM | POA: Insufficient documentation

## 2023-01-15 MED ORDER — ROSUVASTATIN CALCIUM 5 MG PO TABS: 5.0000 mg | ORAL_TABLET | Freq: Every day | ORAL | 3 refills | Status: DC

## 2023-01-15 NOTE — Patient Instructions (Addendum)
Medication Instructions:  Your physician has recommended you make the following change in your medication:  1-START CRESTOR 5 mg by mouth daily  *If you need a refill on your cardiac medications before your next appointment, please call your pharmacy*  Lab Work: Your physician recommends that you return for lab work in: 3 months for lipid and liver panel  If you have labs (blood work) drawn today and your tests are completely normal, you will receive your results only by: MyChart Message (if you have MyChart) OR A paper copy in the mail If you have any lab test that is abnormal or we need to change your treatment, we will call you to review the results.  Testing/Procedures: None ordered today.  Follow-Up: At [facility], you and your health needs are our priority.  As part of our continuing mission to provide you with exceptional heart care, we have created designated Provider Care Teams.  These Care Teams include your primary Cardiologist (physician) and Advanced Practice Providers (APPs -  Physician Assistants and Nurse Practitioners) who all work together to provide you with the care you need, when you need it.  We recommend signing up for the patient portal called "MyChart".  Sign up information is provided on this After Visit Summary.  MyChart is used to connect with patients for Virtual Visits (Telemedicine).  Patients are able to view lab/test results, encounter notes, upcoming appointments, etc.  Non-urgent messages can be sent to your provider as well.   To learn more about what you can do with MyChart, go to [email or web address].    Your next appointment:   As needed  Provider:   Charlton Haws, MD

## 2023-05-13 ENCOUNTER — Other Ambulatory Visit: Payer: Self-pay

## 2023-05-13 DIAGNOSIS — E782 Mixed hyperlipidemia: Secondary | ICD-10-CM

## 2023-05-13 DIAGNOSIS — I2510 Atherosclerotic heart disease of native coronary artery without angina pectoris: Secondary | ICD-10-CM

## 2023-05-13 NOTE — Progress Notes (Signed)
Patient has only been on his Crestor for 1 month. Patient will wait to get lab work in the middle of December.  Undated lab orders.

## 2023-10-12 DIAGNOSIS — Z125 Encounter for screening for malignant neoplasm of prostate: Secondary | ICD-10-CM | POA: Diagnosis not present

## 2023-10-12 DIAGNOSIS — Z131 Encounter for screening for diabetes mellitus: Secondary | ICD-10-CM | POA: Diagnosis not present

## 2023-10-12 DIAGNOSIS — E782 Mixed hyperlipidemia: Secondary | ICD-10-CM | POA: Diagnosis not present

## 2023-10-12 DIAGNOSIS — N529 Male erectile dysfunction, unspecified: Secondary | ICD-10-CM | POA: Diagnosis not present

## 2023-10-12 DIAGNOSIS — Z23 Encounter for immunization: Secondary | ICD-10-CM | POA: Diagnosis not present

## 2023-10-12 DIAGNOSIS — Z0000 Encounter for general adult medical examination without abnormal findings: Secondary | ICD-10-CM | POA: Diagnosis not present

## 2023-10-15 DIAGNOSIS — Z23 Encounter for immunization: Secondary | ICD-10-CM | POA: Diagnosis not present

## 2023-12-25 DIAGNOSIS — H3091 Unspecified chorioretinal inflammation, right eye: Secondary | ICD-10-CM | POA: Diagnosis not present

## 2023-12-25 DIAGNOSIS — B5801 Toxoplasma chorioretinitis: Secondary | ICD-10-CM | POA: Diagnosis not present

## 2024-01-31 ENCOUNTER — Other Ambulatory Visit: Payer: Self-pay | Admitting: Cardiovascular Disease

## 2024-02-08 ENCOUNTER — Other Ambulatory Visit: Payer: Self-pay | Admitting: Cardiovascular Disease

## 2024-02-08 ENCOUNTER — Other Ambulatory Visit: Payer: Self-pay

## 2024-02-08 DIAGNOSIS — E782 Mixed hyperlipidemia: Secondary | ICD-10-CM

## 2024-02-08 DIAGNOSIS — I2510 Atherosclerotic heart disease of native coronary artery without angina pectoris: Secondary | ICD-10-CM

## 2024-02-24 DIAGNOSIS — M545 Low back pain, unspecified: Secondary | ICD-10-CM | POA: Diagnosis not present
